# Patient Record
Sex: Female | Born: 1940 | Race: White | Hispanic: No | Smoking: Never smoker
Health system: Southern US, Community
[De-identification: ages and names within clinical notes are randomized; demographics above are authoritative.]

## PROBLEM LIST (undated history)

## (undated) DIAGNOSIS — M1990 Unspecified osteoarthritis, unspecified site: Secondary | ICD-10-CM

## (undated) HISTORY — PX: HEMORRHOID SURGERY: SHX153

## (undated) HISTORY — PX: LITHOTRIPSY: SUR834

## (undated) HISTORY — PX: ECTOPIC PREGNANCY SURGERY: SHX613

---

## 2013-12-14 ENCOUNTER — Encounter (HOSPITAL_COMMUNITY): Payer: Self-pay | Admitting: Pharmacy Technician

## 2013-12-21 ENCOUNTER — Encounter (HOSPITAL_COMMUNITY): Payer: Self-pay

## 2013-12-21 ENCOUNTER — Encounter (HOSPITAL_COMMUNITY)
Admission: RE | Admit: 2013-12-21 | Discharge: 2013-12-21 | Disposition: A | Discharge status: Home / Self Care | Payer: Medicare Other | Source: Ambulatory Visit | Attending: Urology | Admitting: Urology

## 2013-12-21 HISTORY — DX: Unspecified osteoarthritis, unspecified site: M19.90

## 2013-12-27 ENCOUNTER — Encounter (HOSPITAL_COMMUNITY): Payer: Self-pay

## 2013-12-27 ENCOUNTER — Encounter (HOSPITAL_COMMUNITY)
Admission: RE | Disposition: A | Discharge status: Home / Self Care | Payer: Self-pay | Source: Ambulatory Visit | Attending: Urology

## 2013-12-27 ENCOUNTER — Ambulatory Visit (HOSPITAL_COMMUNITY)
Admission: RE | Admit: 2013-12-27 | Discharge: 2013-12-27 | Disposition: A | Discharge status: Home / Self Care | Payer: Medicare Other | Source: Ambulatory Visit | Attending: Urology | Admitting: Urology

## 2013-12-27 ENCOUNTER — Ambulatory Visit (HOSPITAL_COMMUNITY): Payer: Medicare Other

## 2013-12-27 DIAGNOSIS — E119 Type 2 diabetes mellitus without complications: Secondary | ICD-10-CM | POA: Insufficient documentation

## 2013-12-27 DIAGNOSIS — K219 Gastro-esophageal reflux disease without esophagitis: Secondary | ICD-10-CM | POA: Insufficient documentation

## 2013-12-27 DIAGNOSIS — F329 Major depressive disorder, single episode, unspecified: Secondary | ICD-10-CM | POA: Insufficient documentation

## 2013-12-27 DIAGNOSIS — F3289 Other specified depressive episodes: Secondary | ICD-10-CM | POA: Insufficient documentation

## 2013-12-27 DIAGNOSIS — Z7982 Long term (current) use of aspirin: Secondary | ICD-10-CM | POA: Insufficient documentation

## 2013-12-27 DIAGNOSIS — I10 Essential (primary) hypertension: Secondary | ICD-10-CM | POA: Insufficient documentation

## 2013-12-27 DIAGNOSIS — E785 Hyperlipidemia, unspecified: Secondary | ICD-10-CM | POA: Insufficient documentation

## 2013-12-27 DIAGNOSIS — N200 Calculus of kidney: Secondary | ICD-10-CM

## 2013-12-27 DIAGNOSIS — Z79899 Other long term (current) drug therapy: Secondary | ICD-10-CM | POA: Insufficient documentation

## 2013-12-27 DIAGNOSIS — Z87442 Personal history of urinary calculi: Secondary | ICD-10-CM | POA: Insufficient documentation

## 2013-12-27 HISTORY — PX: EXTRACORPOREAL SHOCK WAVE LITHOTRIPSY: SHX1557

## 2013-12-27 SURGERY — LITHOTRIPSY, ESWL
Anesthesia: Moderate Sedation | Laterality: Right

## 2013-12-27 MED ORDER — DIAZEPAM 5 MG PO TABS
ORAL_TABLET | ORAL | Status: AC
  Filled 2013-12-27: qty 2

## 2013-12-27 MED ORDER — DIPHENHYDRAMINE HCL 25 MG PO CAPS
ORAL_CAPSULE | ORAL | Status: AC
  Filled 2013-12-27: qty 1

## 2013-12-27 MED ORDER — DIAZEPAM 5 MG PO TABS
10.0000 mg | ORAL_TABLET | Freq: Once | ORAL | Status: AC
  Administered 2013-12-27: 10 mg via ORAL

## 2013-12-27 MED ORDER — GENTAMICIN IN SALINE 1.6-0.9 MG/ML-% IV SOLN
80.0000 mg | Freq: Once | INTRAVENOUS | Status: AC
  Administered 2013-12-27: 80 mg via INTRAVENOUS

## 2013-12-27 MED ORDER — SODIUM CHLORIDE 0.9 % IV SOLN
INTRAVENOUS | Status: DC
  Administered 2013-12-27: 09:00:00 via INTRAVENOUS

## 2013-12-27 MED ORDER — GENTAMICIN IN SALINE 1.6-0.9 MG/ML-% IV SOLN
INTRAVENOUS | Status: AC
  Filled 2013-12-27: qty 50

## 2013-12-27 MED ORDER — DIPHENHYDRAMINE HCL 25 MG PO CAPS
25.0000 mg | ORAL_CAPSULE | Freq: Once | ORAL | Status: AC
  Administered 2013-12-27: 25 mg via ORAL

## 2013-12-27 NOTE — Discharge Instructions (Signed)

## 2013-12-29 ENCOUNTER — Encounter (HOSPITAL_COMMUNITY): Payer: Self-pay | Admitting: Urology

## 2015-06-13 IMAGING — CR DG ABDOMEN 1V
1 series · 1 of 1 positions shown · non-contrast
Comparison: None available

CLINICAL DATA: RIGHT side kidney stone, pre lithotripsy

EXAM:
ABDOMEN - 1 VIEW

[view not recorded]
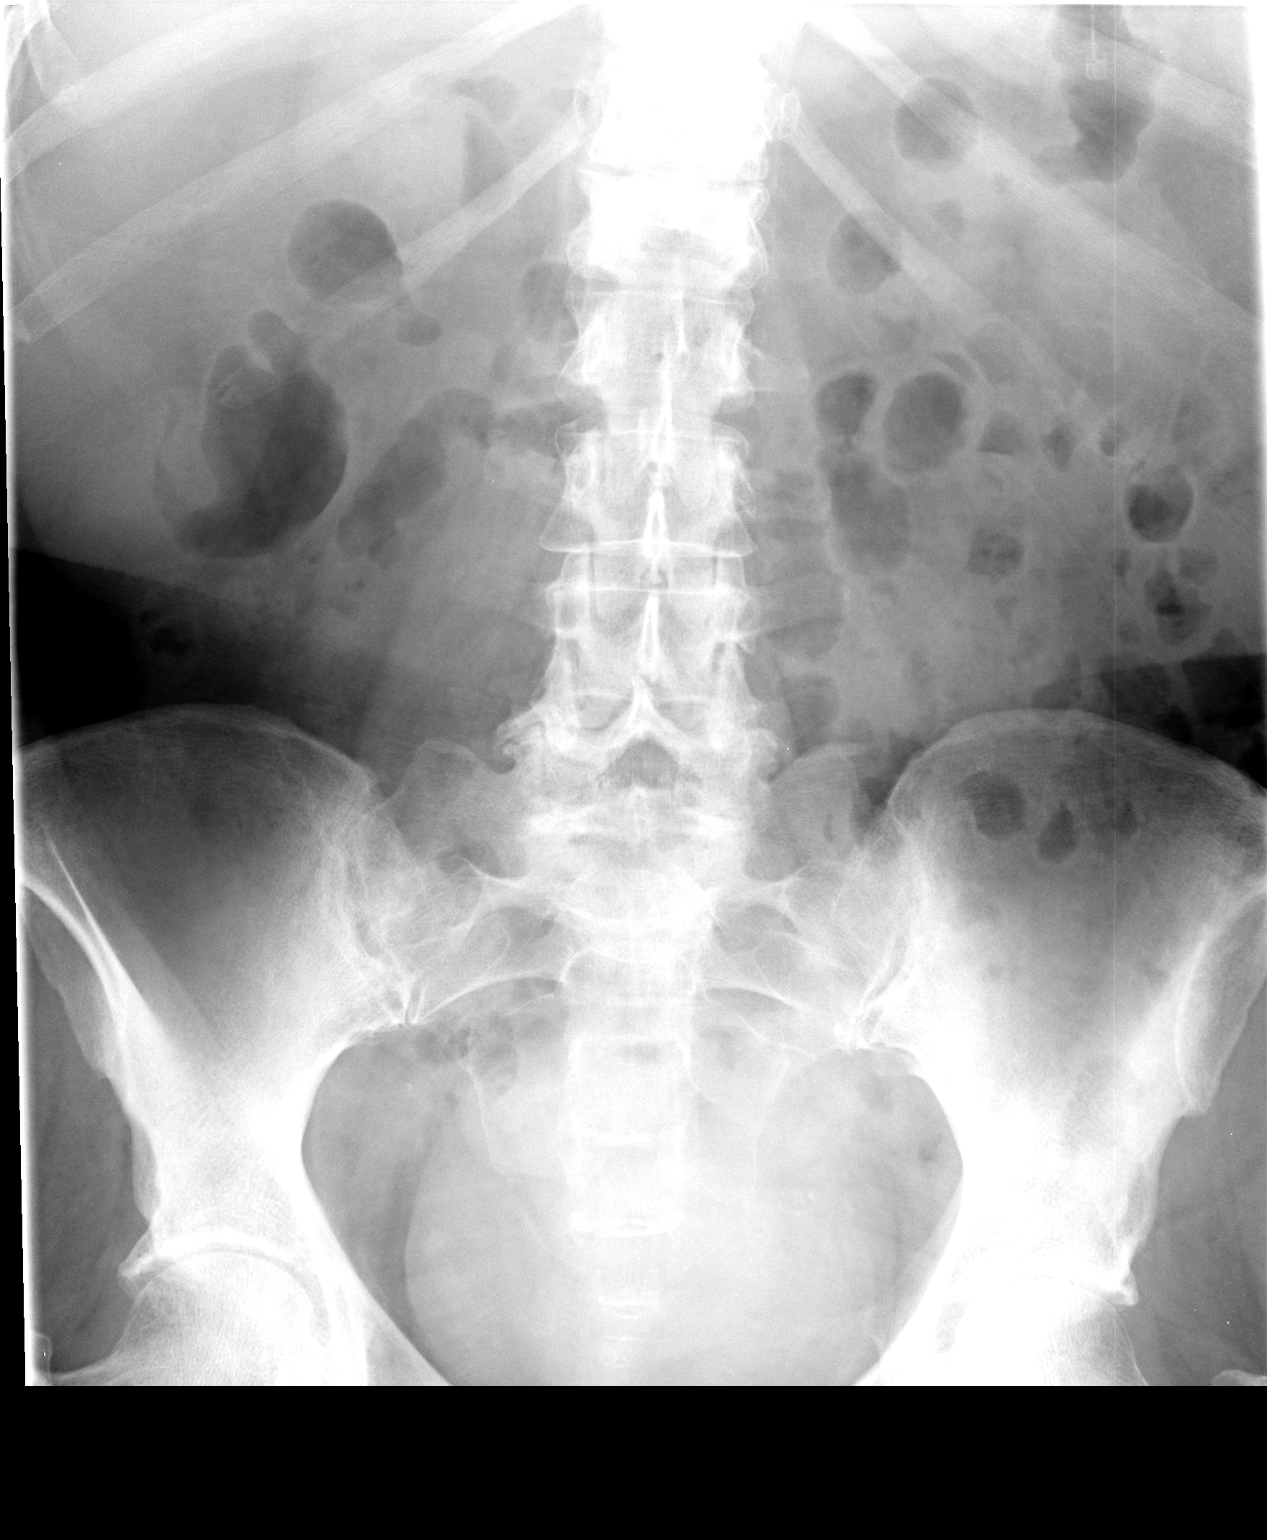

[1 of 1 positions shown; findings below may reference images not displayed]

FINDINGS: Normal bowel gas pattern.

No definite urinary tract calcification identified.

Bladder distended.

Degenerative facet disease changes lower lumbar spine.
IMPRESSION: No urinary tract calcification identified.

## 2020-07-17 DEATH — deceased
